# Patient Record
Sex: Female | Born: 1965 | Race: Black or African American | Hispanic: No | Marital: Single | Smoking: Former smoker
Health system: Southern US, Community
[De-identification: ages and names within clinical notes are randomized; demographics above are authoritative.]

## PROBLEM LIST (undated history)

## (undated) DIAGNOSIS — J45909 Unspecified asthma, uncomplicated: Secondary | ICD-10-CM

---

## 2006-06-14 ENCOUNTER — Emergency Department: Payer: Self-pay | Admitting: Emergency Medicine

## 2011-06-03 ENCOUNTER — Emergency Department: Payer: Self-pay | Admitting: Emergency Medicine

## 2011-07-23 ENCOUNTER — Emergency Department: Payer: Self-pay | Admitting: Emergency Medicine

## 2013-11-01 ENCOUNTER — Emergency Department: Payer: Self-pay | Admitting: Emergency Medicine

## 2015-08-16 ENCOUNTER — Emergency Department: Payer: Self-pay

## 2015-08-16 ENCOUNTER — Emergency Department
Admission: EM | Admit: 2015-08-16 | Discharge: 2015-08-16 | Disposition: A | Discharge status: Home / Self Care | Payer: Self-pay | Attending: Emergency Medicine | Admitting: Emergency Medicine

## 2015-08-16 ENCOUNTER — Encounter: Payer: Self-pay | Admitting: Emergency Medicine

## 2015-08-16 DIAGNOSIS — J45901 Unspecified asthma with (acute) exacerbation: Secondary | ICD-10-CM | POA: Insufficient documentation

## 2015-08-16 DIAGNOSIS — Z87891 Personal history of nicotine dependence: Secondary | ICD-10-CM | POA: Insufficient documentation

## 2015-08-16 DIAGNOSIS — J204 Acute bronchitis due to parainfluenza virus: Secondary | ICD-10-CM

## 2015-08-16 DIAGNOSIS — J069 Acute upper respiratory infection, unspecified: Secondary | ICD-10-CM | POA: Insufficient documentation

## 2015-08-16 HISTORY — DX: Unspecified asthma, uncomplicated: J45.909

## 2015-08-16 MED ORDER — AZITHROMYCIN 250 MG PO TABS: ORAL_TABLET | ORAL | Status: DC

## 2015-08-16 MED ORDER — ALBUTEROL SULFATE HFA 108 (90 BASE) MCG/ACT IN AERS: 2.0000 | INHALATION_SPRAY | Freq: Four times a day (QID) | RESPIRATORY_TRACT | Status: AC | PRN

## 2015-08-16 MED ORDER — GUAIFENESIN-CODEINE 100-10 MG/5ML PO SOLN: 10.0000 mL | ORAL | Status: DC | PRN

## 2015-08-16 NOTE — ED Notes (Signed)
Pt presents with cold sx for one week.

## 2015-08-16 NOTE — Discharge Instructions (Signed)
Acute Bronchitis °Bronchitis is inflammation of the airways that extend from the windpipe into the lungs (bronchi). The inflammation often causes mucus to develop. This leads to a cough, which is the most common symptom of bronchitis.  °In acute bronchitis, the condition usually develops suddenly and goes away over time, usually in a couple weeks. Smoking, allergies, and asthma can make bronchitis worse. Repeated episodes of bronchitis may cause further lung problems.  °CAUSES °Acute bronchitis is most often caused by the same virus that causes a cold. The virus can spread from person to person (contagious) through coughing, sneezing, and touching contaminated objects. °SIGNS AND SYMPTOMS  °· Cough.   °· Fever.   °· Coughing up mucus.   °· Body aches.   °· Chest congestion.   °· Chills.   °· Shortness of breath.   °· Sore throat.   °DIAGNOSIS  °Acute bronchitis is usually diagnosed through a physical exam. Your health care provider will also ask you questions about your medical history. Tests, such as chest X-rays, are sometimes done to rule out other conditions.  °TREATMENT  °Acute bronchitis usually goes away in a couple weeks. Oftentimes, no medical treatment is necessary. Medicines are sometimes given for relief of fever or cough. Antibiotic medicines are usually not needed but may be prescribed in certain situations. In some cases, an inhaler may be recommended to help reduce shortness of breath and control the cough. A cool mist vaporizer may also be used to help thin bronchial secretions and make it easier to clear the chest.  °HOME CARE INSTRUCTIONS °· Get plenty of rest.   °· Drink enough fluids to keep your urine clear or pale yellow (unless you have a medical condition that requires fluid restriction). Increasing fluids may help thin your respiratory secretions (sputum) and reduce chest congestion, and it will prevent dehydration.   °· Take medicines only as directed by your health care provider. °· If  you were prescribed an antibiotic medicine, finish it all even if you start to feel better. °· Avoid smoking and secondhand smoke. Exposure to cigarette smoke or irritating chemicals will make bronchitis worse. If you are a smoker, consider using nicotine gum or skin patches to help control withdrawal symptoms. Quitting smoking will help your lungs heal faster.   °· Reduce the chances of another bout of acute bronchitis by washing your hands frequently, avoiding people with cold symptoms, and trying not to touch your hands to your mouth, nose, or eyes.   °· Keep all follow-up visits as directed by your health care provider.   °SEEK MEDICAL CARE IF: °Your symptoms do not improve after 1 week of treatment.  °SEEK IMMEDIATE MEDICAL CARE IF: °· You develop an increased fever or chills.   °· You have chest pain.   °· You have severe shortness of breath. °· You have bloody sputum.   °· You develop dehydration. °· You faint or repeatedly feel like you are going to pass out. °· You develop repeated vomiting. °· You develop a severe headache. °MAKE SURE YOU:  °· Understand these instructions. °· Will watch your condition. °· Will get help right away if you are not doing well or get worse. °  °This information is not intended to replace advice given to you by your health care provider. Make sure you discuss any questions you have with your health care provider. °  °Document Released: 11/17/2004 Document Revised: 10/31/2014 Document Reviewed: 04/02/2013 °Elsevier Interactive Patient Education ©2016 [employer] ° °Upper Respiratory Infection, Adult °Most upper respiratory infections (URIs) are a viral infection of the air passages leading   to the lungs. A URI affects the nose, throat, and upper air passages. The most common type of URI is nasopharyngitis and is typically referred to as "the common cold." °URIs run their course and usually go away on their own. Most of the time, a URI does not require medical attention, but  sometimes a bacterial infection in the upper airways can follow a viral infection. This is called a secondary infection. Sinus and middle ear infections are common types of secondary upper respiratory infections. °Bacterial pneumonia can also complicate a URI. A URI can worsen asthma and chronic obstructive pulmonary disease (COPD). Sometimes, these complications can require emergency medical care and may be life threatening.  °CAUSES °Almost all URIs are caused by viruses. A virus is a type of germ and can spread from one person to another.  °RISKS FACTORS °You may be at risk for a URI if:  °· You smoke.   °· You have chronic heart or lung disease. °· You have a weakened defense (immune) system.   °· You are very young or very old.   °· You have nasal allergies or asthma. °· You work in crowded or poorly ventilated areas. °· You work in health care facilities or schools. °SIGNS AND SYMPTOMS  °Symptoms typically develop 2-3 days after you come in contact with a cold virus. Most viral URIs last 7-10 days. However, viral URIs from the influenza virus (flu virus) can last 14-18 days and are typically more severe. Symptoms may include:  °· Runny or stuffy (congested) nose.   °· Sneezing.   °· Cough.   °· Sore throat.   °· Headache.   °· Fatigue.   °· Fever.   °· Loss of appetite.   °· Pain in your forehead, behind your eyes, and over your cheekbones (sinus pain). °· Muscle aches.   °DIAGNOSIS  °Your health care provider may diagnose a URI by: °· Physical exam. °· Tests to check that your symptoms are not due to another condition such as: °¨ Strep throat. °¨ Sinusitis. °¨ Pneumonia. °¨ Asthma. °TREATMENT  °A URI goes away on its own with time. It cannot be cured with medicines, but medicines may be prescribed or recommended to relieve symptoms. Medicines may help: °· Reduce your fever. °· Reduce your cough. °· Relieve nasal congestion. °HOME CARE INSTRUCTIONS  °· Take medicines only as directed by your health care  provider.   °· Gargle warm saltwater or take cough drops to comfort your throat as directed by your health care provider. °· Use a warm mist humidifier or inhale steam from a shower to increase air moisture. This may make it easier to breathe. °· Drink enough fluid to keep your urine clear or pale yellow.   °· Eat soups and other clear broths and maintain good nutrition.   °· Rest as needed.   °· Return to work when your temperature has returned to normal or as your health care provider advises. You may need to stay home longer to avoid infecting others. You can also use a face mask and careful hand washing to prevent spread of the virus. °· Increase the usage of your inhaler if you have asthma.   °· Do not use any tobacco products, including cigarettes, chewing tobacco, or electronic cigarettes. If you need help quitting, ask your health care provider. °PREVENTION  °The best way to protect yourself from getting a cold is to practice good hygiene.  °· Avoid oral or hand contact with people with cold symptoms.   °· Wash your hands often if contact occurs.   °There is no clear evidence that   vitamin C, vitamin E, echinacea, or exercise reduces the chance of developing a cold. However, it is always recommended to get plenty of rest, exercise, and practice good nutrition.  °SEEK MEDICAL CARE IF:  °· You are getting worse rather than better.   °· Your symptoms are not controlled by medicine.   °· You have chills. °· You have worsening shortness of breath. °· You have brown or red mucus. °· You have yellow or brown nasal discharge. °· You have pain in your face, especially when you bend forward. °· You have a fever. °· You have swollen neck glands. °· You have pain while swallowing. °· You have white areas in the back of your throat. °SEEK IMMEDIATE MEDICAL CARE IF:  °· You have severe or persistent: °¨ Headache. °¨ Ear pain. °¨ Sinus pain. °¨ Chest pain. °· You have chronic lung disease and any of the  following: °¨ Wheezing. °¨ Prolonged cough. °¨ Coughing up blood. °¨ A change in your usual mucus. °· You have a stiff neck. °· You have changes in your: °¨ Vision. °¨ Hearing. °¨ Thinking. °¨ Mood. °MAKE SURE YOU:  °· Understand these instructions. °· Will watch your condition. °· Will get help right away if you are not doing well or get worse. °  °This information is not intended to replace advice given to you by your health care provider. Make sure you discuss any questions you have with your health care provider. °  °Document Released: 04/05/2001 Document Revised: 02/24/2015 Document Reviewed: 01/15/2014 °Elsevier Interactive Patient Education ©2016 [employer] ° °

## 2015-08-16 NOTE — ED Provider Notes (Signed)
[facility] Emergency Department Provider Note  ____________________________________________  Time seen: Approximately 11:22 AM  I have reviewed the triage vital signs and the nursing notes.   HISTORY  Chief Complaint URI    HPI Willette Braceeressa A Bailly is a 49 y.o. female presents to the emergency room for evaluation of cough 1 week. 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   Past Medical History  Diagnosis Date  . Asthma     There are no active problems to display for this patient.   History reviewed. No pertinent past surgical history.  No current outpatient prescriptions on file.  Allergies Review of patient's allergies indicates no known allergies.  No family history on file.  Social History Social History  Substance Use Topics  . Smoking status: Former Games developermoker  . Smokeless tobacco: None  . Alcohol Use: No    Review of Systems Constitutional: No fever/chills Eyes: No visual changes. ENT: No sore throat. Cardiovascular: Denies chest pain. Respiratory: Positive for cough and shortness of breath. Gastrointestinal: No abdominal pain.  No nausea, no vomiting.  No diarrhea.  No constipation. Genitourinary: Negative for dysuria. Musculoskeletal: Negative for back pain. Skin: Negative for rash. Neurological: Negative for headaches, focal weakness or numbness.  10-point ROS otherwise negative.  ____________________________________________   PHYSICAL EXAM:  VITAL SIGNS: ED Triage Vitals  Enc Vitals Group     BP 08/16/15 1051 153/90 mmHg     Pulse Rate 08/16/15 1051 78     Resp 08/16/15 1051 22     Temp 08/16/15 1051 98.2 F (36.8 C)     Temp Source 08/16/15 1051 Oral     SpO2 08/16/15 1051 98 %     Weight 08/16/15 1051 154 lb (69.854 kg)     Height 08/16/15 1051 5\' 7"  (1.702 m)     Head Cir --      Peak Flow --      Pain Score 08/16/15 1049 7     Pain Loc --      Pain Edu? --      Excl. in GC?  --     Constitutional: Alert and oriented. Well appearing and in no acute distress. Eyes: Conjunctivae are normal. PERRL. EOMI. Head: Atraumatic. Nose: No congestion/rhinnorhea. Mouth/Throat: Mucous membranes are moist.  Oropharynx non-erythematous. Neck: No stridor.   Cardiovascular: Normal rate, regular rhythm. Grossly normal heart sounds.  Good peripheral circulation. Respiratory: Normal respiratory effort.  No retractions. Lungs with coarse breath sounds noted bilaterally. Musculoskeletal: No lower extremity tenderness nor edema.  No joint effusions. Neurologic:  Normal speech and language. No gross focal neurologic deficits are appreciated. No gait instability. Skin:  Skin is warm, dry and intact. No rash noted. Psychiatric: Mood and affect are normal. Speech and behavior are normal.  ____________________________________________   LABS (all labs ordered are listed, but only abnormal results are displayed)  Labs Reviewed - No data to display ____________________________________________   RADIOLOGY  Negative for pneumonia or atelectasis. Interpreted by radiologist and reviewed by myself. ____________________________________________   PROCEDURES  Procedure(s) performed: None  Critical Care performed: No  ____________________________________________   INITIAL IMPRESSION / ASSESSMENT AND PLAN / ED COURSE  Pertinent labs & imaging results that were available during my care of the patient were reviewed by me and considered in my medical decision making (see chart for details).  Acute URI. Rx given for Z-Pak, Robitussin-AC, and albuterol inhaler. Patient given a work note for 2 nights. She is to  follow-up with her PCP or return to the ER with any worsening symptomology.  Patient voices no other emergency medical complaints at this time. ____________________________________________   FINAL CLINICAL IMPRESSION(S) / ED DIAGNOSES  Final diagnoses:  None       Evangeline Dakin, PA-C 08/16/15 1213  Jennye Moccasin, MD 08/16/15 [phone]

## 2017-05-18 ENCOUNTER — Emergency Department
Admission: EM | Admit: 2017-05-18 | Discharge: 2017-05-18 | Disposition: A | Discharge status: Home / Self Care | Payer: Self-pay | Attending: Student in an Organized Health Care Education/Training Program | Admitting: Student in an Organized Health Care Education/Training Program

## 2017-05-18 ENCOUNTER — Emergency Department: Payer: Self-pay

## 2017-05-18 ENCOUNTER — Encounter: Payer: Self-pay | Admitting: Emergency Medicine

## 2017-05-18 DIAGNOSIS — R609 Edema, unspecified: Secondary | ICD-10-CM

## 2017-05-18 DIAGNOSIS — R600 Localized edema: Secondary | ICD-10-CM | POA: Insufficient documentation

## 2017-05-18 DIAGNOSIS — J45909 Unspecified asthma, uncomplicated: Secondary | ICD-10-CM | POA: Insufficient documentation

## 2017-05-18 DIAGNOSIS — Z87891 Personal history of nicotine dependence: Secondary | ICD-10-CM | POA: Insufficient documentation

## 2017-05-18 LAB — BASIC METABOLIC PANEL
ANION GAP: 6 (ref 5–15)
BUN: 11 mg/dL (ref 6–20)
CALCIUM: 9 mg/dL (ref 8.9–10.3)
CO2: 22 mmol/L (ref 22–32)
CREATININE: 0.84 mg/dL (ref 0.44–1.00)
Chloride: 109 mmol/L (ref 101–111)
GFR calc Af Amer: >60 mL/min (ref >60)
GLUCOSE: 80 mg/dL (ref 65–99)
Potassium: 3.7 mmol/L (ref 3.5–5.1)
Sodium: 137 mmol/L (ref 135–145)

## 2017-05-18 LAB — BRAIN NATRIURETIC PEPTIDE: B Natriuretic Peptide: 64 pg/mL (ref 0.0–100.0)

## 2017-05-18 LAB — CBC
HEMATOCRIT: 28.6 % — AB (ref 35.0–47.0)
Hemoglobin: 8.8 g/dL — ABNORMAL LOW (ref 12.0–16.0)
MCH: 21.6 pg — AB (ref 26.0–34.0)
MCHC: 30.9 g/dL — AB (ref 32.0–36.0)
MCV: 69.8 fL — AB (ref 80.0–100.0)
PLATELETS: 168 10*3/uL (ref 150–440)
RBC: 4.1 MIL/uL (ref 3.80–5.20)
RDW: 19.5 % — AB (ref 11.5–14.5)
WBC: 6.2 10*3/uL (ref 3.6–11.0)

## 2017-05-18 LAB — TROPONIN I: Troponin I: <0.03 ng/mL (ref <0.03)

## 2017-05-18 MED ORDER — DOXYCYCLINE HYCLATE 50 MG PO CAPS: 50.0000 mg | ORAL_CAPSULE | Freq: Two times a day (BID) | ORAL | 0 refills | Status: AC

## 2017-05-18 MED ORDER — T.E.D. BELOW KNEE/MEDIUM MISC: 4.0000 "application " | Freq: Every day | 0 refills | Status: AC

## 2017-05-18 NOTE — ED Provider Notes (Signed)
[facility] Emergency Department Provider Note    First MD Initiated Contact with Patient 05/18/17 1851     (approximate)  I have reviewed the triage vital signs and the nursing notes.   HISTORY  Chief Complaint Leg Swelling    HPI Bridget Barnes is a 51 y.o. female history of asthma presents with chief complaint of lower extremity swelling that started on Monday. Patient states that she does not recall any things that incited this. Has not been having any chest pain. States that she has been having intermittent shortness of breath but that is not irregular for her. Patient denies any orthopnea. No history of heart attack. Patient doesn't know his crack cocaine use.   Past Medical History:  Diagnosis Date  . Asthma    History reviewed. No pertinent family history. History reviewed. No pertinent surgical history. There are no active problems to display for this patient.     Prior to Admission medications   Medication Sig Start Date End Date Taking? Authorizing Provider  albuterol (PROVENTIL HFA;VENTOLIN HFA) 108 (90 BASE) MCG/ACT inhaler Inhale 2 puffs into the lungs every 6 (six) hours as needed for wheezing or shortness of breath. 08/16/15   Beers, Charmayne Sheer, PA-C  azithromycin (ZITHROMAX Z-PAK) 250 MG tablet Take 2 tablets (500 mg) on  Day 1,  followed by 1 tablet (250 mg) once daily on Days 2 through 5. 08/16/15   Beers, Charmayne Sheer, PA-C  doxycycline (VIBRAMYCIN) 50 MG capsule Take 1 capsule (50 mg total) by mouth 2 (two) times daily. 05/18/17 05/25/17  Willy Eddy, MD  Elastic Bandages & Supports (T.E.D. BELOW KNEE/MEDIUM) MISC 4 application by Does not apply route daily. 05/18/17   Willy Eddy, MD  guaiFENesin-codeine 100-10 MG/5ML syrup Take 10 mLs by mouth every 4 (four) hours as needed for cough. 08/16/15   Beers, Charmayne Sheer, PA-C    Allergies Patient has no known allergies.    Social History Social History  Substance Use Topics   . Smoking status: Former Games developer  . Smokeless tobacco: Never Used  . Alcohol use No    Review of Systems Patient denies headaches, rhinorrhea, blurry vision, numbness, shortness of breath, chest pain, edema, cough, abdominal pain, nausea, vomiting, diarrhea, dysuria, fevers, rashes or hallucinations unless otherwise stated above in HPI. ____________________________________________   PHYSICAL EXAM:  VITAL SIGNS: Vitals:   05/18/17 1700  BP: 133/82  Pulse: 70  Resp: 18  Temp: 98.6 F (37 C)    Constitutional: Alert and oriented. Well appearing and in no acute distress. Eyes: Conjunctivae are normal.  Head: Atraumatic. Nose: No congestion/rhinnorhea. Mouth/Throat: Mucous membranes are moist.  Poor dentition Neck: No stridor. Painless ROM.  Cardiovascular: Normal rate, regular rhythm. Grossly normal heart sounds.  Good peripheral circulation. Respiratory: Normal respiratory effort.  No retractions. Lungs CTAB. Gastrointestinal: Soft and nontender. No distention. No abdominal bruits. No CVA tenderness. Musculoskeletal: No lower extremity tenderness , 2+ bilateral pitting edema to the knees.  No joint effusions. Neurologic:  Normal speech and language. No gross focal neurologic deficits are appreciated. No facial droop Skin:  Skin is warm, dry and intact. No rash noted. Psychiatric: Mood and affect are normal. Speech and behavior are normal.  ____________________________________________   LABS (all labs ordered are listed, but only abnormal results are displayed)  Results for orders placed or performed during the hospital encounter of 05/18/17 (from the past 24 hour(s))  CBC     Status: Abnormal   Collection Time: 05/18/17  5:07 PM  Result Value Ref Range   WBC 6.2 3.6 - 11.0 K/uL   RBC 4.10 3.80 - 5.20 MIL/uL   Hemoglobin 8.8 (L) 12.0 - 16.0 g/dL   HCT 34.728.6 (L) 42.535.0 - 95.647.0 %   MCV 69.8 (L) 80.0 - 100.0 fL   MCH 21.6 (L) 26.0 - 34.0 pg   MCHC 30.9 (L) 32.0 - 36.0 g/dL    RDW 38.719.5 (H) 56.411.5 - 14.5 %   Platelets 168 150 - 440 K/uL  Basic metabolic panel     Status: None   Collection Time: 05/18/17  7:06 PM  Result Value Ref Range   Sodium 137 135 - 145 mmol/L   Potassium 3.7 3.5 - 5.1 mmol/L   Chloride 109 101 - 111 mmol/L   CO2 22 22 - 32 mmol/L   Glucose, Bld 80 65 - 99 mg/dL   BUN 11 6 - 20 mg/dL   Creatinine, Ser 3.320.84 0.44 - 1.00 mg/dL   Calcium 9.0 8.9 - 95.110.3 mg/dL   GFR calc non Af Amer >60 >60 mL/min   GFR calc Af Amer >60 >60 mL/min   Anion gap 6 5 - 15  Troponin I     Status: None   Collection Time: 05/18/17  7:06 PM  Result Value Ref Range   Troponin I <0.03 <0.03 ng/mL  Brain natriuretic peptide     Status: None   Collection Time: 05/18/17  7:06 PM  Result Value Ref Range   B Natriuretic Peptide 64.0 0.0 - 100.0 pg/mL   ____________________________________________  EKG My review and personal interpretation at Time: 17:05   Indication: swelling  Rate: 65  Rhythm: sinus Axis: normal Other: poor r wave progression, no stemi, normal intervals ____________________________________________  RADIOLOGY  I personally reviewed all radiographic images ordered to evaluate for the above acute complaints and reviewed radiology reports and findings.  These findings were personally discussed with the patient.  Please see medical record for radiology report.  ____________________________________________   PROCEDURES  Procedure(s) performed:  Procedures    Critical Care performed: no ____________________________________________   INITIAL IMPRESSION / ASSESSMENT AND PLAN / ED COURSE  Pertinent labs & imaging results that were available during my care of the patient were reviewed by me and considered in my medical decision making (see chart for details).  DDX: edema, chf, aki, dvt  Bridget Barnes is a 51 y.o. who presents to the ED with Lower extremity edema as described above. Patient without any chest pain. She is no crackles on exam.  Chest x-ray shows no evidence of effusion. Patient does not have any fever or white count. Was report of some possible infiltrate. Given her substance abuse will treat with antibiotics.  Do not feel emergent CT imaging clinically indicated at this time. Patient has no hypoxia. Will order lower extremity duplex to evaluate for evidence of DVT. Patient denies any chest pain or symptoms of suggestive of ACS.  Clinical Course as of May 18 2058  Thu May 18, 2017  2051 (Far is reassuring. She has normal renal function. Lower extremity ultrasound shows no evidence of DVT. She does not have evidence of diffuse anasarca and edema is isolated to the lower extremities below the knees. No evidence of infection, there is no evidence of crepitus or erythema. We'll give up her prescription for compression stockings and referral to primary care physician.  Have discussed with the patient and available family all diagnostics and treatments performed thus far and all questions were  answered to the best of my ability. The patient demonstrates understanding and agreement with plan.   [PR]    Clinical Course User Index [PR] Willy Eddyobinson, Vandana Haman, MD     ____________________________________________   FINAL CLINICAL IMPRESSION(S) / ED DIAGNOSES  Final diagnoses:  Peripheral edema      NEW MEDICATIONS STARTED DURING THIS VISIT:  New Prescriptions   DOXYCYCLINE (VIBRAMYCIN) 50 MG CAPSULE    Take 1 capsule (50 mg total) by mouth 2 (two) times daily.   ELASTIC BANDAGES & SUPPORTS (T.E.D. BELOW KNEE/MEDIUM) MISC    4 application by Does not apply route daily.     Note:  This document was prepared using Dragon voice recognition software and may include unintentional dictation errors.    Willy Eddyobinson, Salima Rumer, MD 05/18/17 2059

## 2017-05-18 NOTE — ED Triage Notes (Signed)
Pt from home with swelling lower extremities. Pt states she had this several years ago but does not remember what the diagnosis was. Pt states this started yesterday. Pt endorses sob, which also started yesterday. Pt alert & oriented with NAD noted.

## 2017-05-18 NOTE — ED Notes (Signed)
Spoke with MD regarding anemia/iron supplementation. Per MD to patient, to take OTC iron 2 tabs every other day

## 2020-02-17 ENCOUNTER — Encounter: Payer: Self-pay | Admitting: Emergency Medicine

## 2020-02-17 ENCOUNTER — Emergency Department: Payer: No Typology Code available for payment source

## 2020-02-17 ENCOUNTER — Other Ambulatory Visit: Payer: Self-pay

## 2020-02-17 ENCOUNTER — Emergency Department
Admission: EM | Admit: 2020-02-17 | Discharge: 2020-02-17 | Disposition: A | Discharge status: Home / Self Care | Payer: No Typology Code available for payment source | Attending: Emergency Medicine | Admitting: Emergency Medicine

## 2020-02-17 DIAGNOSIS — Y998 Other external cause status: Secondary | ICD-10-CM | POA: Insufficient documentation

## 2020-02-17 DIAGNOSIS — Y92410 Unspecified street and highway as the place of occurrence of the external cause: Secondary | ICD-10-CM | POA: Insufficient documentation

## 2020-02-17 DIAGNOSIS — J45909 Unspecified asthma, uncomplicated: Secondary | ICD-10-CM | POA: Diagnosis not present

## 2020-02-17 DIAGNOSIS — Z87891 Personal history of nicotine dependence: Secondary | ICD-10-CM | POA: Diagnosis not present

## 2020-02-17 DIAGNOSIS — Y9389 Activity, other specified: Secondary | ICD-10-CM | POA: Insufficient documentation

## 2020-02-17 DIAGNOSIS — M549 Dorsalgia, unspecified: Secondary | ICD-10-CM | POA: Diagnosis present

## 2020-02-17 MED ORDER — METHOCARBAMOL 500 MG PO TABS
500.0000 mg | ORAL_TABLET | Freq: Once | ORAL | Status: AC
  Administered 2020-02-17: 500 mg via ORAL
  Filled 2020-02-17: qty 1

## 2020-02-17 MED ORDER — IBUPROFEN 600 MG PO TABS: 600.0000 mg | ORAL_TABLET | Freq: Four times a day (QID) | ORAL | 0 refills | Status: AC | PRN

## 2020-02-17 MED ORDER — CYCLOBENZAPRINE HCL 5 MG PO TABS: ORAL_TABLET | ORAL | 0 refills | Status: AC
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
## 2020-11-03 ENCOUNTER — Other Ambulatory Visit: Payer: Self-pay

## 2020-11-03 ENCOUNTER — Emergency Department
Admission: EM | Admit: 2020-11-03 | Discharge: 2020-11-03 | Disposition: A | Discharge status: Home / Self Care | Payer: Self-pay | Attending: Emergency Medicine | Admitting: Emergency Medicine

## 2020-11-03 DIAGNOSIS — J45909 Unspecified asthma, uncomplicated: Secondary | ICD-10-CM | POA: Insufficient documentation

## 2020-11-03 DIAGNOSIS — Z87891 Personal history of nicotine dependence: Secondary | ICD-10-CM | POA: Insufficient documentation

## 2020-11-03 DIAGNOSIS — J398 Other specified diseases of upper respiratory tract: Secondary | ICD-10-CM | POA: Insufficient documentation

## 2020-11-03 DIAGNOSIS — H1031 Unspecified acute conjunctivitis, right eye: Secondary | ICD-10-CM | POA: Insufficient documentation

## 2020-11-03 MED ORDER — ERYTHROMYCIN 5 MG/GM OP OINT: 1.0000 "application " | TOPICAL_OINTMENT | Freq: Four times a day (QID) | OPHTHALMIC | 0 refills | Status: AC

## 2020-11-03 MED ORDER — ERYTHROMYCIN 5 MG/GM OP OINT
TOPICAL_OINTMENT | Freq: Once | OPHTHALMIC | Status: AC
  Administered 2020-11-03: 1 via OPHTHALMIC
  Filled 2020-11-03: qty 1

## 2020-11-03 NOTE — ED Provider Notes (Signed)
[facility] Emergency Department Provider Note ____________________________________________   Event Date/Time   First MD Initiated Contact with Patient 11/03/20 1732     (approximate)  I have reviewed the triage vital signs and the nursing notes.  HISTORY  Chief Complaint Eye Pain   HPI Bridget Barnes is a 55 y.o. femalewho presents to the ED for evaluation of right eye drainage and discomfort.  Chart review indicates no relevant medical history.  Patient reports 3 days of respiratory congestion and clear rhinorrhea.  Reports associated discharge, redness and discomfort to her right eye.  Denies any symptoms to her left.  Denies trauma to her right eye, denies ear pain, vision changes or poor vision.  Denies recent antibiotics.  Denies sore throat, shortness of breath, cough, fever, syncope, headache.  Does not wear contact lenses.   Past Medical History:  Diagnosis Date  . Asthma     There are no problems to display for this patient.   History reviewed. No pertinent surgical history.  Prior to Admission medications   Medication Sig Start Date End Date Taking? Authorizing Provider  erythromycin ophthalmic ointment Place 1 application into the right eye 4 (four) times daily. 11/03/20  Yes Delton Prairie, MD  albuterol (PROVENTIL HFA;VENTOLIN HFA) 108 (90 BASE) MCG/ACT inhaler Inhale 2 puffs into the lungs every 6 (six) hours as needed for wheezing or shortness of breath. 08/16/15   Beers, Charmayne Sheer, PA-C  cyclobenzaprine (FLEXERIL) 5 MG tablet Take 1-2 tablets 3 times daily as needed 02/17/20   Enid Derry, PA-C  Elastic Bandages & Supports (T.E.D. BELOW KNEE/MEDIUM) MISC 4 application by Does not apply route daily. 05/18/17   Willy Eddy, MD  ibuprofen (ADVIL) 600 MG tablet Take 1 tablet (600 mg total) by mouth every 6 (six) hours as needed. 02/17/20   Enid Derry, PA-C    Allergies Patient has no known allergies.  No family history on  file.  Social History Social History   Tobacco Use  . Smoking status: Former Games developer  . Smokeless tobacco: Never Used  Vaping Use  . Vaping Use: Never used  Substance Use Topics  . Alcohol use: No    Review of Systems  Constitutional: No fever/chills Eyes: No visual changes.  Positive for right eye drainage. ENT: No sore throat. Cardiovascular: Denies chest pain. Respiratory: Denies shortness of breath. Gastrointestinal: No abdominal pain.  No nausea, no vomiting.  No diarrhea.  No constipation. Genitourinary: Negative for dysuria. Musculoskeletal: Negative for back pain. Skin: Negative for rash. Neurological: Negative for headaches, focal weakness or numbness.  ____________________________________________   PHYSICAL EXAM:  VITAL SIGNS: Vitals:   11/03/20 1801  BP: 133/78  Pulse: 74  Resp: 16  Temp: 98 F (36.7 C)  SpO2: 98%     Constitutional: Alert and oriented. Well appearing and in no acute distress. Eyes:  PERRL. EOMI. purulent conjunctivitis isolated to the right eye.  No periorbital swelling, erythema or tenderness.  Left eye appears normal, and has no surrounding tenderness. Head: Atraumatic. Bilateral TMs visualized without erythema or purulence. Nose: Clear congestion/rhinnorhea. Mouth/Throat: Mucous membranes are moist.  Oropharynx non-erythematous.  Uvula is midline. Neck: No stridor. No cervical spine tenderness to palpation. Cardiovascular: Normal rate, regular rhythm. Grossly normal heart sounds.  Good peripheral circulation. Respiratory: Normal respiratory effort.  No retractions. Lungs CTAB. Gastrointestinal: Soft , nondistended, nontender to palpation. No CVA tenderness. Musculoskeletal: No lower extremity tenderness nor edema.  No joint effusions. No signs of acute trauma. Neurologic:  Normal  speech and language. No gross focal neurologic deficits are appreciated. No gait instability noted. Skin:  Skin is warm, dry and intact. No rash  noted. Psychiatric: Mood and affect are normal. Speech and behavior are normal.  ____________________________________________   PROCEDURES and INTERVENTIONS  Procedure(s) performed (including Critical Care):  Procedures  Medications  erythromycin ophthalmic ointment (1 application Right Eye Given 11/03/20 1745)    ____________________________________________   MDM / ED COURSE   55 year old woman without relevant medical history presents to the ED with right purulent conjunctivitis, consistent with bacterial conjunctivitis, and requiring topical antibiotics for outpatient management.  Normal vitals and room air.  Exam without evidence of periorbital pathology, vision changes, distress or need for systemic antibiotics.  We discussed topical erythromycin ointment as an outpatient, and we discussed symptomatic measures for her respiratory congestion.  We discussed return precautions for the ED and patient is medically stable for discharge home. ____________________________________________   FINAL CLINICAL IMPRESSION(S) / ED DIAGNOSES  Final diagnoses:  Acute bacterial conjunctivitis of right eye  Congestion of upper respiratory tract     ED Discharge Orders         Ordered    erythromycin ophthalmic ointment  4 times daily        11/03/20 1738           Skipper Dacosta Katrinka Blazing   Note:  This document was prepared using Conservation officer, historic buildings and may include unintentional dictation errors.   Delton Prairie, MD 11/03/20 [phone]

## 2020-11-03 NOTE — ED Triage Notes (Signed)
Pt comes with c/o 3 day eye pain, redness and discharge.

## 2020-11-03 NOTE — Discharge Instructions (Signed)
As we discussed, it looks like you have bacterial conjunctivitis of the right eye, please use the erythromycin eye ointment 4 times daily to that right eye.  You can also pick up over-the-counter Sudafed, or its generic equivalent, to help with your congestion.  Please take Tylenol and ibuprofen/Advil for your pain.  It is safe to take them together, or to alternate them every few hours.  Take up to 1000mg  of Tylenol at a time, up to 4 times per day.  Do not take more than 4000 mg of Tylenol in 24 hours.  For ibuprofen, take 400-600 mg, 4-5 times per day.  Return to the ED with any worsening symptoms, fevers, worsening vision or inability to see out of the right eyeball, despite the above medications.

## 2023-05-03 ENCOUNTER — Encounter: Payer: Self-pay | Admitting: Family

## 2023-06-19 ENCOUNTER — Ambulatory Visit: Payer: Medicaid Other | Admitting: Family

## 2023-08-24 ENCOUNTER — Ambulatory Visit: Payer: Medicaid Other | Admitting: Family

## 2024-08-10 ENCOUNTER — Emergency Department
Admission: EM | Admit: 2024-08-10 | Discharge: 2024-08-10 | Disposition: A | Discharge status: Home / Self Care | Attending: Emergency Medicine | Admitting: Emergency Medicine

## 2024-08-10 ENCOUNTER — Other Ambulatory Visit: Payer: Self-pay

## 2024-08-10 ENCOUNTER — Emergency Department

## 2024-08-10 DIAGNOSIS — J45909 Unspecified asthma, uncomplicated: Secondary | ICD-10-CM | POA: Diagnosis not present

## 2024-08-10 DIAGNOSIS — R051 Acute cough: Secondary | ICD-10-CM

## 2024-08-10 DIAGNOSIS — J181 Lobar pneumonia, unspecified organism: Secondary | ICD-10-CM | POA: Diagnosis not present

## 2024-08-10 DIAGNOSIS — J189 Pneumonia, unspecified organism: Secondary | ICD-10-CM

## 2024-08-10 DIAGNOSIS — R059 Cough, unspecified: Secondary | ICD-10-CM | POA: Diagnosis present

## 2024-08-10 LAB — CBC
HCT: 40 % (ref 36.0–46.0)
Hemoglobin: 13.2 g/dL (ref 12.0–15.0)
MCH: 30.5 pg (ref 26.0–34.0)
MCHC: 33 g/dL (ref 30.0–36.0)
MCV: 92.4 fL (ref 80.0–100.0)
Platelets: 169 K/uL (ref 150–400)
RBC: 4.33 MIL/uL (ref 3.87–5.11)
RDW: 12.9 % (ref 11.5–15.5)
WBC: 8.4 K/uL (ref 4.0–10.5)
nRBC: 0 % (ref 0.0–0.2)

## 2024-08-10 LAB — BASIC METABOLIC PANEL WITH GFR
Anion gap: 9 (ref 5–15)
BUN: 11 mg/dL (ref 6–20)
CO2: 21 mmol/L — ABNORMAL LOW (ref 22–32)
Calcium: 9.4 mg/dL (ref 8.9–10.3)
Chloride: 106 mmol/L (ref 98–111)
Creatinine, Ser: 0.83 mg/dL (ref 0.44–1.00)
GFR, Estimated: >60 mL/min (ref >60)
Glucose, Bld: 103 mg/dL — ABNORMAL HIGH (ref 70–99)
Potassium: 3.8 mmol/L (ref 3.5–5.1)
Sodium: 136 mmol/L (ref 135–145)

## 2024-08-10 LAB — RESP PANEL BY RT-PCR (RSV, FLU A&B, COVID)  RVPGX2
Influenza A by PCR: NEGATIVE
Influenza B by PCR: NEGATIVE
Resp Syncytial Virus by PCR: NEGATIVE
SARS Coronavirus 2 by RT PCR: NEGATIVE

## 2024-08-10 MED ORDER — AMOXICILLIN-POT CLAVULANATE 875-125 MG PO TABS: 1.0000 | ORAL_TABLET | Freq: Two times a day (BID) | ORAL | 0 refills | Status: AC

## 2024-08-10 MED ORDER — AZITHROMYCIN 500 MG PO TABS: 500.0000 mg | ORAL_TABLET | Freq: Every day | ORAL | 0 refills | Status: AC

## 2024-08-10 MED ORDER — PREDNISONE 20 MG PO TABS: 40.0000 mg | ORAL_TABLET | Freq: Every day | ORAL | 0 refills | Status: AC

## 2024-08-10 NOTE — ED Triage Notes (Signed)
 Patient c/o non-productive cough, SOB with exertion/coughing, congestion, reduced appetite.

## 2024-08-10 NOTE — ED Notes (Signed)
 Pt given DC instructions. Pt verbalized understanding of medications and follow up care. Pt ambulatory from ED without difficulty.

## 2024-08-10 NOTE — Discharge Instructions (Addendum)
 Department today for evaluation of your cough.  This could be related to a viral respiratory infection, but your x-[place] Sorn did show some signs concerning for a possible small pneumonia.  I sent a prescription for antibiotics as well as a course of steroids to your pharmacy.  You can use your albuterol  inhaler as needed.  Follow-up a primary care doctor for further evaluation.  Return to the ER for new or worsening symptoms.

## 2024-08-10 NOTE — ED Provider Notes (Signed)
 [facility] Provider Note    Event Date/Time   First MD Initiated Contact with Patient 08/10/24 1647     (approximate)   History   Cough   HPI  Bridget Barnes is a 58 year old female with history of asthma presenting to the emergency department for evaluation of cough and congestion.  Patient reports symptoms been going on for a week.  No fevers or known sick contacts.  Has not been using her albuterol  inhaler more often than usual, usually uses about 2 times per week.  Denies chest pain.     Physical Exam   Triage Vital Signs: ED Triage Vitals [08/10/24 1635]  Encounter Vitals Group     BP (!) 189/100     Girls Systolic BP Percentile      Girls Diastolic BP Percentile      Boys Systolic BP Percentile      Boys Diastolic BP Percentile      Pulse Rate 82     Resp (!) 24     Temp 98.3 F (36.8 C)     Temp Source Oral     SpO2 100 %     Weight 154 lb (69.9 kg)     Height 5' 7 (1.702 m)     Head Circumference      Peak Flow      Pain Score 0     Pain Loc      Pain Education      Exclude from Growth Chart     Most recent vital signs: Vitals:   08/10/24 1635  BP: (!) 189/100  Pulse: 82  Resp: (!) 24  Temp: 98.3 F (36.8 C)  SpO2: 100%     General: Awake, interactive  CV:  Good peripheral perfusion Resp:  Unlabored respirations, good auscultation without wheezing Abd:  Nondistended.  Neuro:  Symmetric facial movement, fluid speech Other:   Oropharynx without swelling or significant erythema   ED Results / Procedures / Treatments   Labs (all labs ordered are listed, but only abnormal results are displayed) Labs Reviewed  BASIC METABOLIC PANEL WITH GFR - Abnormal; Notable for the following components:      Result Value   CO2 21 (*)    Glucose, Bld 103 (*)    All other components within normal limits  RESP PANEL BY RT-PCR (RSV, FLU A&B, COVID)  [id number]  CBC     EKG EKG independently reviewed and interpreted by myself  demonstrates:  EKG demonstrates normal sinus rhythm heart rate 68, PR 186, QRS 82, QTc 429, no acute ST changes  RADIOLOGY Imaging independently reviewed and interpreted by myself demonstrates:  Chest x-Bridget Barnes with questionable left basilar consolidation  Formal Radiology Read:  DG Chest 2 View Result Date: 08/10/2024 CLINICAL DATA:  Shortness of breath and cough EXAM: CHEST - 2 VIEW COMPARISON:  Chest radiograph dated 05/18/2017 FINDINGS: Faint area of increased density in the posterior left lung base, likely atelectasis. Pneumonia is less likely. Clinical correlation is recommended. No pleural effusion pneumothorax. The cardiac silhouette is within normal limits. No acute osseous pathology. IMPRESSION: Left lung base atelectasis versus less likely pneumonia. Electronically Signed   By: Vanetta Chou M.D.   On: 08/10/2024 17:14    PROCEDURES:  Critical Care performed: No  Procedures   MEDICATIONS ORDERED IN ED: Medications - No data to display   IMPRESSION / MDM / ASSESSMENT AND PLAN / ED COURSE  I reviewed the triage vital signs and the nursing notes.  Differential diagnosis includes, but is not limited to, viral illness, pneumonia, asthma exacerbation, lower suspicion pneumothorax  Patient's presentation is most consistent with acute presentation with potential threat to life or bodily function.  58 year old female presenting to the emergency department for evaluation of cough and congestion.  Documented as tachypneic in triage with associated hypertension, respiratory rate normal at time of my initial evaluation.  Reassuring CBC and BMP.  Viral swab pending.  X-[name] with questionable pneumonia.  EKG without acute ischemic findings.  Given possible consolidation on x-[name] and clinical history of primary cough, do think treatment for pneumonia is reasonable.  Does not have acute asthma exacerbation here, but will DC with short course of steroids.  Overall reassuring exam, do think  patient is stable for discharge.  Patient comfortable this plan.  Strict return precautions provided.  Patient discharged stable condition.     FINAL CLINICAL IMPRESSION(S) / ED DIAGNOSES   Final diagnoses:  Acute cough  Community acquired pneumonia of left lower lobe of lung     Rx / DC Orders   ED Discharge Orders          Ordered    predniSONE (DELTASONE) 20 MG tablet  Daily with breakfast        08/10/24 1732    amoxicillin-clavulanate (AUGMENTIN) 875-125 MG tablet  2 times daily        08/10/24 1732    azithromycin  (ZITHROMAX ) 500 MG tablet  Daily        08/10/24 1732             Note:  This document was prepared using Dragon voice recognition software and may include unintentional dictation errors.   Levander Slate, MD 08/10/24 [phone]
